# Patient Record
Sex: Female | Born: 1995 | Race: Black or African American | Hispanic: No | Marital: Single
Health system: Southern US, Community
[De-identification: ages and names within clinical notes are randomized; demographics above are authoritative.]

## PROBLEM LIST (undated history)

## (undated) ENCOUNTER — Emergency Department: Discharge status: Absent without leave | Payer: Self-pay

---
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
## 2019-12-23 ENCOUNTER — Emergency Department (HOSPITAL_COMMUNITY)
Admission: EM | Admit: 2019-12-23 | Discharge: 2019-12-23 | Disposition: A | Discharge status: Home / Self Care | Payer: Self-pay | Attending: Emergency Medicine | Admitting: Emergency Medicine

## 2019-12-23 ENCOUNTER — Emergency Department (HOSPITAL_COMMUNITY): Payer: Self-pay

## 2019-12-23 ENCOUNTER — Other Ambulatory Visit: Payer: Self-pay

## 2019-12-23 ENCOUNTER — Encounter (HOSPITAL_COMMUNITY): Payer: Self-pay | Admitting: Emergency Medicine

## 2019-12-23 DIAGNOSIS — R002 Palpitations: Secondary | ICD-10-CM

## 2019-12-23 DIAGNOSIS — R0789 Other chest pain: Secondary | ICD-10-CM

## 2019-12-23 DIAGNOSIS — F419 Anxiety disorder, unspecified: Secondary | ICD-10-CM

## 2019-12-23 LAB — I-STAT CHEM 8, ED
BUN: 9 mg/dL (ref 6–20)
Calcium, Ion: 1.09 mmol/L — ABNORMAL LOW (ref 1.15–1.40)
Chloride: 106 mmol/L (ref 98–111)
Creatinine, Ser: 0.5 mg/dL (ref 0.44–1.00)
Glucose, Bld: 77 mg/dL (ref 70–99)
HCT: 41 % (ref 36.0–46.0)
Hemoglobin: 13.9 g/dL (ref 12.0–15.0)
Potassium: 3.8 mmol/L (ref 3.5–5.1)
Sodium: 139 mmol/L (ref 135–145)
TCO2: 25 mmol/L (ref 22–32)

## 2019-12-23 LAB — I-STAT BETA HCG BLOOD, ED (MC, WL, AP ONLY): I-stat hCG, quantitative: <5 m[IU]/mL (ref <5)

## 2019-12-23 MED ORDER — HYDROXYZINE HCL 10 MG PO TABS: 10.0000 mg | ORAL_TABLET | Freq: Three times a day (TID) | ORAL | 0 refills | Status: AC | PRN

## 2019-12-23 NOTE — ED Provider Notes (Signed)
[facility] EMERGENCY DEPARTMENT Provider Note   CSN: [id number] Arrival date & time: 12/23/19  1611     History Chief Complaint  Patient presents with  . Chest Pain    Krista Huang is a 24 y.o. female.  HPI    Patient is a 24 year old female who presents the emergency department today complaining of intermittent chest pain and palpitations.  States that for the last month that she has had intermittent chest pain and palpitations.  At present, she has no pain or other symptoms. Chest pain is located across the bilat upper chest but sometimes seems to move to the right or left side of the chest. The pain also sometimes travels to her bilateral arms. Pain feels like a poking sensation that lasts for seconds at time. At its worst, sxs were occurring about 1-3 times per day, however recently after her stress has improved somewhat her sxs occur only 1 time per day and sometimes do not occur at all.   Pain seems to be exacerbated when she thinks about something that stresses her out. She denies other exacerbating factors. States sxs improve when she calms down or takes a hot bath.  She also reports associated palpitations. Denies associated SOB or pain with inspiration.   She states that recently she thinks her sxs are worse because she has been stressed out because she has been studying to take the LSAT.  She previously had not done well on the exam and is retaking it and very stressed out about whether or not she will be able to get into law school.  She reports a history of anxiety and states that she has had a lot of somatic manifestations from her anxiety in the past.  She used to be in therapy but she is not currently in therapy or receiving any treatment at this time.  Denies leg pain/swelling, hemoptysis, cough, fever, recent surgery/trauma, recent long travel, hormone use, personal hx of cancer, or hx of DVT/PE.  She has been drinking a lot of coffee for the last 2 months.  She drinks about 2-3 cups per day.   History reviewed. No pertinent past medical history.  There are no problems to display for this patient.   History reviewed. No pertinent surgical history.   OB History   No obstetric history on file.     No family history on file.  Social History   Tobacco Use  . Smoking status: Never Smoker  . Smokeless tobacco: Never Used  Substance Use Topics  . Alcohol use: Never  . Drug use: Never    Home Medications Prior to Admission medications   Medication Sig Start Date End Date Taking? Authorizing Provider  hydrOXYzine (ATARAX/VISTARIL) 10 MG tablet Take 1 tablet (10 mg total) by mouth every 8 (eight) hours as needed for anxiety. 12/23/19   Imane Burrough S, PA-C    Allergies    Patient has no known allergies.  Review of Systems   Review of Systems  Constitutional: Negative for chills and fever.  HENT: Negative for ear pain and sore throat.   Eyes: Negative for pain and visual disturbance.  Respiratory: Negative for cough and shortness of breath.   Cardiovascular: Positive for chest pain and palpitations.  Gastrointestinal: Negative for abdominal pain, constipation, diarrhea, nausea and vomiting.  Genitourinary: Negative for dysuria and hematuria.  Musculoskeletal: Negative for back pain.  Skin: Negative for rash.  Neurological: Negative for dizziness.  All other systems reviewed and are negative.  Physical Exam Updated Vital Signs BP 117/65 (BP Location: Right Arm)   Pulse 88   Temp 99.2 F (37.3 C) (Oral)   Resp 17   LMP 12/19/2019   SpO2 100%   Physical Exam Vitals and nursing note reviewed.  Constitutional:      General: She is not in acute distress.    Appearance: She is well-developed. She is not ill-appearing or toxic-appearing.  HENT:     Head: Normocephalic and atraumatic.  Eyes:     Conjunctiva/sclera: Conjunctivae normal.  Cardiovascular:     Rate and Rhythm: Normal rate and regular rhythm.     Heart  sounds: No murmur.  Pulmonary:     Effort: Pulmonary effort is normal. No respiratory distress.     Breath sounds: Normal breath sounds. No decreased breath sounds, wheezing, rhonchi or rales.  Abdominal:     Palpations: Abdomen is soft.     Tenderness: There is no abdominal tenderness.  Musculoskeletal:     Cervical back: Neck supple.     Right lower leg: No tenderness. No edema.     Left lower leg: No tenderness. No edema.  Skin:    General: Skin is warm and dry.  Neurological:     Mental Status: She is alert.     ED Results / Procedures / Treatments   Labs (all labs ordered are listed, but only abnormal results are displayed) Labs Reviewed  I-STAT CHEM 8, ED - Abnormal; Notable for the following components:      Result Value   Calcium, Ion 1.09 (*)    All other components within normal limits  I-STAT BETA HCG BLOOD, ED ([facility], [facility], [facility] ONLY)    EKG None  Radiology DG Chest Portable 1 View  Result Date: 12/23/2019 CLINICAL DATA:  24 year old female with intermittent chest pain, palpitations and left arm tingling for 1 month. EXAM: PORTABLE CHEST 1 VIEW COMPARISON:  None. FINDINGS: Portable AP upright view at 1722 hours. Lung volumes and mediastinal contours are normal. Visualized tracheal air column is within normal limits. Allowing for portable technique the lungs are clear. No pneumothorax or pleural effusion. Negative visible bowel gas and osseous structures. IMPRESSION: Negative portable chest. Electronically Signed   By: Odessa Fleming M.D.   On: 12/23/2019 17:29    Procedures Procedures (including critical care time)  Medications Ordered in ED Medications - No data to display  ED Course  I have reviewed the triage vital signs and the nursing notes.  Pertinent labs & imaging results that were available during my care of the patient were reviewed by me and considered in my medical decision making (see chart for details).    MDM Rules/Calculators/A&P                       24 year old female presenting for evaluation of intermittent left-sided chest pain that has been ongoing for the last month.  She reports associated palpitations with this.  She is currently studying to get into law school in recently took the LSAT.  She has a history of anxiety and states that symptoms seem to be provoked when she thinks about the testing whether or not she will get into law school.  She previously has been treated for anxiety with therapy but is not doing so currently because she does not have insurance.  She has no risk factors for PE.  Symptoms not sound typical of ACS.  Doubt esophageal rupture, AAA, dissection or other emergent pathology of  her symptoms at this time.  Lungs good auscultation bilaterally.  Heart with regular rate and rhythm.  She does have any worse symptoms.  Chest x-ray is clear.  EKG is nonischemic.  Chem-8 with no acute abnormalities.  Patient nonpregnant.  During my evaluation she is completely asymptomatic at this time.  Did discuss PCP follow-up with recommendations to get into therapy versus being treated for anxiety with medications.  Will give Rx for hydroxyzine.  Advised on specific return precautions.  She voiced understanding plan reasons to return.  All questions answered.  Patient stable for discharge.   Final Clinical Impression(s) / ED Diagnoses Final diagnoses:  Atypical chest pain  Palpitations  Anxiety    Rx / DC Orders ED Discharge Orders         Ordered    hydrOXYzine (ATARAX/VISTARIL) 10 MG tablet  Every 8 hours PRN     12/23/19 1840           Shakeema Lippman, [employer], PA-C 12/23/19 1840    Malvin Johns, MD 12/25/19 1123

## 2019-12-23 NOTE — Discharge Instructions (Addendum)
Prescription given for Hydroxyzine to help with anxiety. Take medication as directed and do not operate machinery, drive a car, or work while taking this medication as it can make you drowsy.   Please follow up with your primary care provider within 5-7 days for re-evaluation of your symptoms. If you do not have a primary care provider, information for a healthcare clinic has been provided for you to make arrangements for follow up care. Please return to the emergency department for any new or worsening symptoms.

## 2019-12-23 NOTE — ED Notes (Signed)
Patient verbalizes understanding of discharge instructions. Opportunity for questioning and answers were provided. Armband removed by staff, pt discharged from ED.  

## 2019-12-23 NOTE — ED Triage Notes (Signed)
Pt c/o intermittent chest pain, palpitations and left arm tingling x 1 month. Pt thought symptoms were related to anxiety about an upcoming exam, but reports symptoms have continued.

## 2019-12-27 ENCOUNTER — Encounter: Payer: Self-pay | Admitting: Emergency Medicine

## 2020-10-21 IMAGING — DX DG CHEST 1V PORT
1 series · 1 of 1 positions shown · non-contrast
Comparison: None.

CLINICAL DATA: 23-year-old female with intermittent chest pain,
palpitations and left arm tingling for 1 month.

EXAM:
PORTABLE CHEST 1 VIEW

[chest]
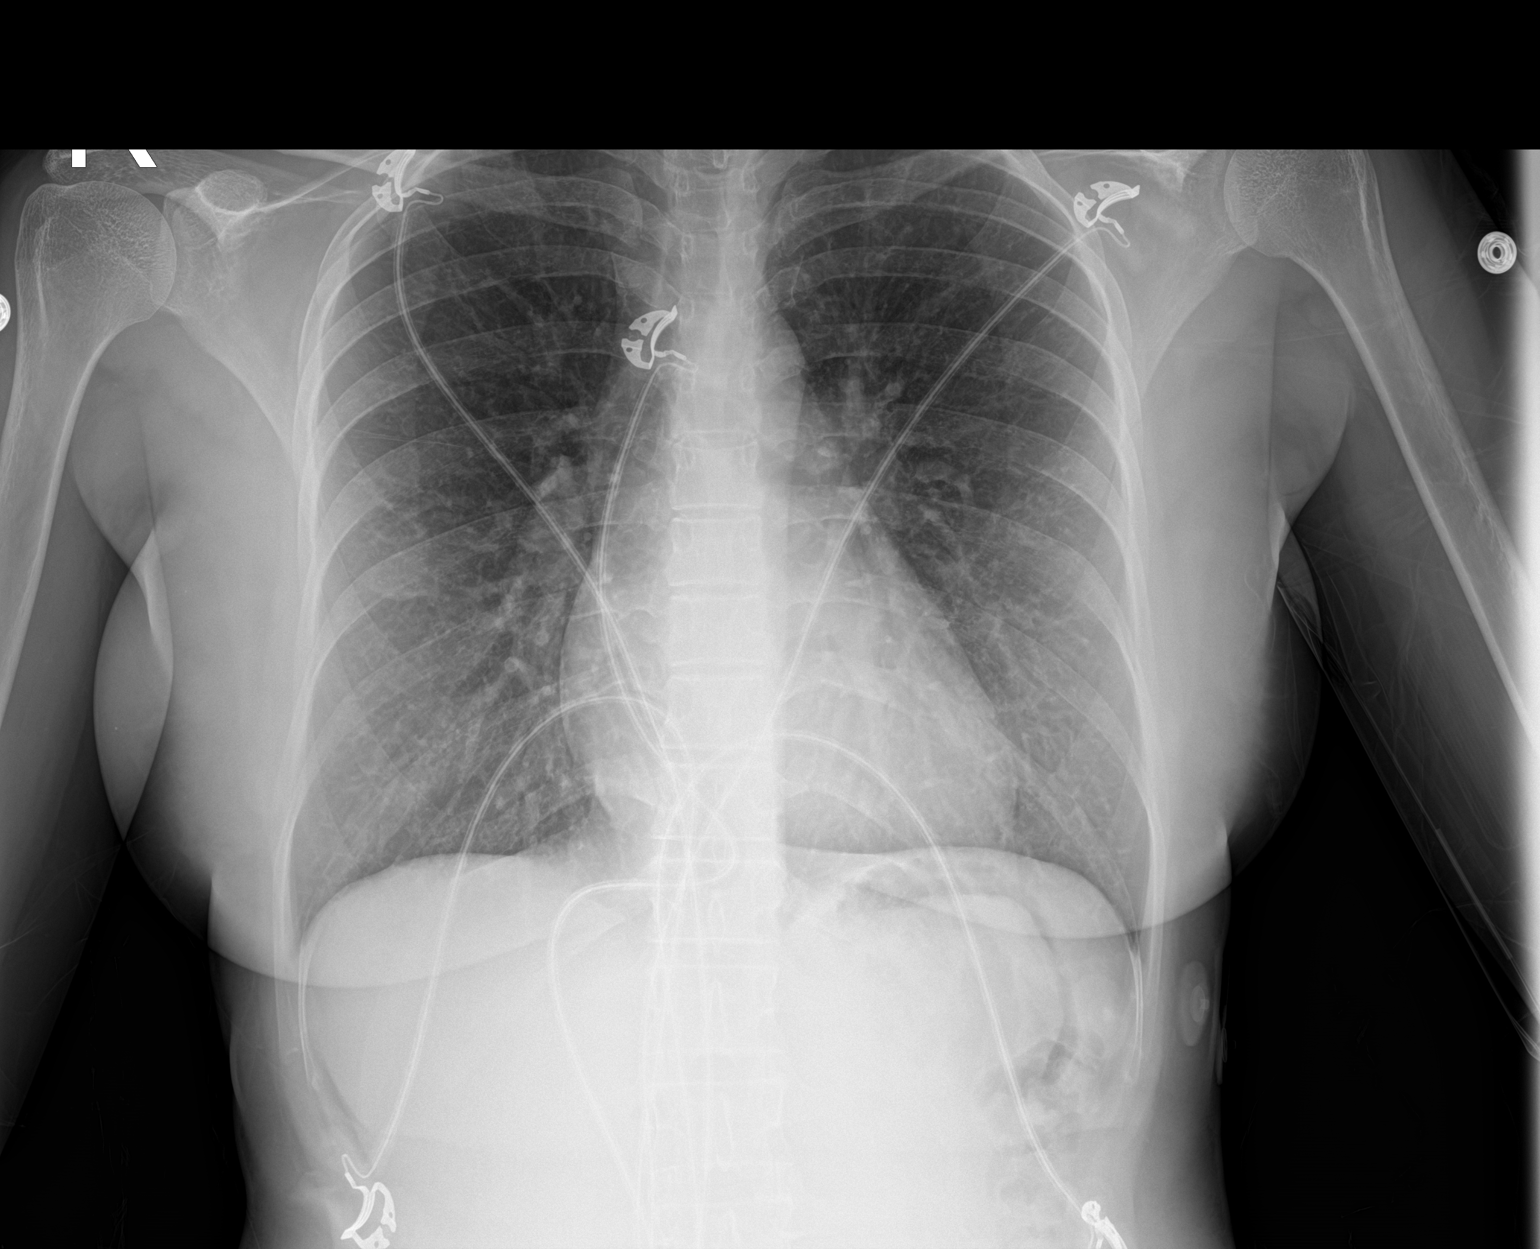

[1 of 1 positions shown; findings below may reference images not displayed]

FINDINGS: Portable AP upright view at 0033 hours. Lung volumes and mediastinal
contours are normal. Visualized tracheal air column is within normal
limits. Allowing for portable technique the lungs are clear. No
pneumothorax or pleural effusion. Negative visible bowel gas and
osseous structures.
IMPRESSION: Negative portable chest.
